# Patient Record
Sex: Male | Born: 1965 | Race: White | Hispanic: No | Marital: Married | Smoking: Never smoker
Health system: Southern US, Community
[De-identification: ages and names within clinical notes are randomized; demographics above are authoritative.]

## PROBLEM LIST (undated history)

## (undated) DIAGNOSIS — J45909 Unspecified asthma, uncomplicated: Secondary | ICD-10-CM

## (undated) HISTORY — PX: EYE SURGERY: SHX253

---

## 2020-04-27 ENCOUNTER — Other Ambulatory Visit: Payer: Self-pay

## 2020-04-27 ENCOUNTER — Emergency Department (HOSPITAL_BASED_OUTPATIENT_CLINIC_OR_DEPARTMENT_OTHER)
Admission: EM | Admit: 2020-04-27 | Discharge: 2020-04-27 | Disposition: A | Discharge status: Home / Self Care | Payer: Managed Care, Other (non HMO) | Attending: Emergency Medicine | Admitting: Emergency Medicine

## 2020-04-27 ENCOUNTER — Encounter (HOSPITAL_BASED_OUTPATIENT_CLINIC_OR_DEPARTMENT_OTHER): Payer: Self-pay

## 2020-04-27 ENCOUNTER — Emergency Department (HOSPITAL_BASED_OUTPATIENT_CLINIC_OR_DEPARTMENT_OTHER): Payer: Managed Care, Other (non HMO)

## 2020-04-27 DIAGNOSIS — R1909 Other intra-abdominal and pelvic swelling, mass and lump: Secondary | ICD-10-CM

## 2020-04-27 DIAGNOSIS — N492 Inflammatory disorders of scrotum: Secondary | ICD-10-CM | POA: Diagnosis not present

## 2020-04-27 DIAGNOSIS — J45909 Unspecified asthma, uncomplicated: Secondary | ICD-10-CM | POA: Insufficient documentation

## 2020-04-27 DIAGNOSIS — R2241 Localized swelling, mass and lump, right lower limb: Secondary | ICD-10-CM | POA: Diagnosis present

## 2020-04-27 HISTORY — DX: Unspecified asthma, uncomplicated: J45.909

## 2020-04-27 LAB — COMPREHENSIVE METABOLIC PANEL
ALT: 22 U/L (ref 0–44)
AST: 22 U/L (ref 15–41)
Albumin: 4.4 g/dL (ref 3.5–5.0)
Alkaline Phosphatase: 36 U/L — ABNORMAL LOW (ref 38–126)
Anion gap: 10 (ref 5–15)
BUN: 14 mg/dL (ref 6–20)
CO2: 24 mmol/L (ref 22–32)
Calcium: 8.9 mg/dL (ref 8.9–10.3)
Chloride: 105 mmol/L (ref 98–111)
Creatinine, Ser: 1.02 mg/dL (ref 0.61–1.24)
GFR, Estimated: >60 mL/min (ref >60)
Glucose, Bld: 102 mg/dL — ABNORMAL HIGH (ref 70–99)
Potassium: 4.2 mmol/L (ref 3.5–5.1)
Sodium: 139 mmol/L (ref 135–145)
Total Bilirubin: 0.5 mg/dL (ref 0.3–1.2)
Total Protein: 7.5 g/dL (ref 6.5–8.1)

## 2020-04-27 LAB — URINALYSIS, ROUTINE W REFLEX MICROSCOPIC
Bilirubin Urine: NEGATIVE
Glucose, UA: NEGATIVE mg/dL
Hgb urine dipstick: NEGATIVE
Ketones, ur: NEGATIVE mg/dL
Leukocytes,Ua: NEGATIVE
Nitrite: NEGATIVE
Protein, ur: NEGATIVE mg/dL
Specific Gravity, Urine: 1.015 (ref 1.005–1.030)
pH: 6.5 (ref 5.0–8.0)

## 2020-04-27 LAB — CBC WITH DIFFERENTIAL/PLATELET
Abs Immature Granulocytes: 0.01 10*3/uL (ref 0.00–0.07)
Basophils Absolute: 0.1 10*3/uL (ref 0.0–0.1)
Basophils Relative: 1 %
Eosinophils Absolute: 0.2 10*3/uL (ref 0.0–0.5)
Eosinophils Relative: 3 %
HCT: 44.2 % (ref 39.0–52.0)
Hemoglobin: 14.4 g/dL (ref 13.0–17.0)
Immature Granulocytes: 0 %
Lymphocytes Relative: 43 %
Lymphs Abs: 2.6 10*3/uL (ref 0.7–4.0)
MCH: 26.5 pg (ref 26.0–34.0)
MCHC: 32.6 g/dL (ref 30.0–36.0)
MCV: 81.4 fL (ref 80.0–100.0)
Monocytes Absolute: 0.5 10*3/uL (ref 0.1–1.0)
Monocytes Relative: 8 %
Neutro Abs: 2.8 10*3/uL (ref 1.7–7.7)
Neutrophils Relative %: 45 %
Platelets: 283 10*3/uL (ref 150–400)
RBC: 5.43 MIL/uL (ref 4.22–5.81)
RDW: 14.4 % (ref 11.5–15.5)
WBC: 6 10*3/uL (ref 4.0–10.5)
nRBC: 0 % (ref 0.0–0.2)

## 2020-04-27 MED ORDER — IBUPROFEN 800 MG PO TABS
800.0000 mg | ORAL_TABLET | Freq: Once | ORAL | Status: AC
  Administered 2020-04-27: 800 mg via ORAL
  Filled 2020-04-27: qty 1

## 2020-04-27 MED ORDER — IOHEXOL 300 MG/ML  SOLN
100.0000 mL | Freq: Once | INTRAMUSCULAR | Status: AC | PRN
  Administered 2020-04-27: 100 mL via INTRAVENOUS

## 2020-04-27 MED ORDER — LIDOCAINE HCL (PF) 1 % IJ SOLN
5.0000 mL | Freq: Once | INTRAMUSCULAR | Status: DC
  Filled 2020-04-27: qty 5

## 2020-04-27 MED ORDER — CLINDAMYCIN HCL 150 MG PO CAPS: 450.0000 mg | ORAL_CAPSULE | Freq: Three times a day (TID) | ORAL | 0 refills | Status: AC

## 2020-04-27 NOTE — ED Notes (Signed)
Patient transported to CT 

## 2020-04-27 NOTE — ED Provider Notes (Signed)
[facility] EMERGENCY DEPARTMENT Provider Note   CSN: [id number] Arrival date & time: 04/27/20  0947     History Chief Complaint  Patient presents with  . Groin Swelling    Brett Rogers is a 55 y.o. male.  55 y.o male with a PMH of Asthma presents to the ED with a chief complaint of right groin swelling for 2 days.  Patient reports noticing these suddenly, began with a sharp pain to the right groin, this has now extended onto part of his testicle.  The pain is exacerbated with sitting, movement, and palpation.  He reports taking some ibuprofen in order to help with symptoms without much improvement.  He reports being seen in urgent care and sent into the ED for further evaluation of his groin swelling.  There is redness to the area.  He has not had any penile discharge, rashes, fever, urinary symptoms. No prior history of sexually transmitted infection, he is currently sexually active with his wife for the past 20 years.  The history is provided by the patient and medical records.       Past Medical History:  Diagnosis Date  . Asthma     There are no problems to display for this patient.   Past Surgical History:  Procedure Laterality Date  . EYE SURGERY         History reviewed. No pertinent family history.  Social History   Tobacco Use  . Smoking status: Never Smoker  . Smokeless tobacco: Never Used  Vaping Use  . Vaping Use: Never used  Substance Use Topics  . Alcohol use: Never  . Drug use: Never    Home Medications Prior to Admission medications   Medication Sig Start Date End Date Taking? Authorizing Provider  clindamycin (CLEOCIN) 150 MG capsule Take 3 capsules (450 mg total) by mouth 3 (three) times daily for 7 days. 04/27/20 05/04/20 Yes Soto, Leonie DouglasJohana, PA-C  ibuprofen (ADVIL) 200 MG tablet Take 200 mg by mouth every 6 (six) hours as needed.   Yes [provider]  vitamin C (ASCORBIC ACID) 500 MG tablet Take 500 mg by mouth daily.    Yes [provider]  VITAMIN D-VITAMIN K PO Take by mouth.   Yes [provider]    Allergies    Patient has no known allergies.  Review of Systems   Review of Systems  Constitutional: Negative for fever.  HENT: Negative for sore throat.   Respiratory: Negative for shortness of breath.   Cardiovascular: Negative for chest pain.  Gastrointestinal: Negative for abdominal pain, diarrhea, nausea and vomiting.  Genitourinary: Positive for testicular pain. Negative for flank pain, genital sores, hematuria, penile discharge and penile pain.  Musculoskeletal: Negative for back pain and neck pain.  Skin: Positive for color change.  Neurological: Negative for light-headedness and headaches.  All other systems reviewed and are negative.   Physical Exam Updated Vital Signs BP 125/64 (BP Location: Right Arm)   Pulse 70   Temp 97.7 F (36.5 C) (Oral)   Resp 18   Ht 5\' 9"  (1.753 m)   Wt 79.4 kg   SpO2 98%   BMI 25.84 kg/m   Physical Exam Vitals and nursing note reviewed. Exam conducted with a chaperone present.  Constitutional:      Appearance: Normal appearance.  HENT:     Head: Normocephalic and atraumatic.     Nose: Nose normal.     Mouth/Throat:     Mouth: Mucous membranes are moist.  Eyes:     Pupils: Pupils are equal, round, and reactive to light.  Cardiovascular:     Rate and Rhythm: Normal rate.  Pulmonary:     Effort: Pulmonary effort is normal.     Breath sounds: No wheezing.  Abdominal:     General: Abdomen is flat.     Tenderness: There is no abdominal tenderness.     Hernia: There is no hernia in the left inguinal area or right inguinal area.  Genitourinary:    Pubic Area: No rash.      Penis: Circumcised. No tenderness, discharge or lesions.      Testes:        Right: Tenderness present.        Left: Tenderness not present.     Epididymis:     Right: Enlarged. Tenderness present.     Left: Not enlarged. No tenderness.       Comments:  Erythema, induration and streaking of the right groin extending into the right testicular region.  Musculoskeletal:     Cervical back: Normal range of motion and neck supple.  Skin:    General: Skin is warm and dry.  Neurological:     Mental Status: He is alert and oriented to person, place, and time.     ED Results / Procedures / Treatments   Labs (all labs ordered are listed, but only abnormal results are displayed) Labs Reviewed  COMPREHENSIVE METABOLIC PANEL - Abnormal; Notable for the following components:      Result Value   Glucose, Bld 102 (*)    Alkaline Phosphatase 36 (*)    All other components within normal limits  CBC WITH DIFFERENTIAL/PLATELET  URINALYSIS, ROUTINE W REFLEX MICROSCOPIC  GC/CHLAMYDIA PROBE AMP ([place]) NOT AT [facility]    EKG None  Radiology CT PELVIS W CONTRAST  Result Date: 04/27/2020 CLINICAL DATA:  Abscess, anal or rectal. Additional history provided: Right-sided testicular mass with tenderness for 2 days; radiopaque marker placed in area of concern. EXAM: CT PELVIS WITH CONTRAST TECHNIQUE: Multidetector CT imaging of the pelvis was performed using the standard protocol following the bolus administration of intravenous contrast. CONTRAST:  OMNIPAQUE IOHEXOL 300 MG/ML  SOLN COMPARISON:  No pertinent prior exams available for comparison. FINDINGS: Urinary Tract:  No abnormality visualized. Bowel:  Unremarkable visualized pelvic bowel loops. Vascular/Lymphatic: No pathologically enlarged lymph nodes. Mild calcified plaque within the bilateral common iliac arteries. Reproductive: Within the subcutaneous soft tissues of the right groin, lateral to the distal aspect of the spermatic cord, there is a nonspecific 1.3 cm round focus of induration (series 5, image 109). There is mild surrounding inflammatory stranding. This finding is located immediately deep to the skin surface marker. Musculoskeletal: No acute bony abnormality or aggressive osseous lesion.  IMPRESSION: 1.3 cm nonspecific round focus of induration within the subcutaneous soft tissues of the right groin, lateral to the distal aspect of the spermatic cord. There is mild surrounding inflammatory stranding. This may reflect a small abscess or small region of phlegmon with surrounding cellulitis, and targeted ultrasound may be helpful for further characterization. Additionally, close clinical follow-up (with imaging follow-up as clinically warranted) recommended to ensure resolution and exclude alternative etiologies (including but not limited to neoplasm). Electronically Signed   By: Jackey Loge DO   On: 04/27/2020 12:07    Procedures .Marland KitchenIncision and Drainage  Date/Time: 04/27/2020 2:05 PM Performed by: Claude Manges, PA-C Authorized by: Claude Manges, PA-C   Consent:    Consent obtained:  Verbal  Consent given by:  Patient   Risks, benefits, and alternatives were discussed: yes   Universal protocol:    Patient identity confirmed:  Verbally with patient Location:    Type:  Abscess   Location:  Anogenital   Anogenital location:  Scrotal wall Pre-procedure details:    Skin preparation:  Povidone-iodine Sedation:    Sedation type:  None Anesthesia:    Anesthesia method:  Local infiltration   Local anesthetic:  Lidocaine 1% w/o epi Procedure details:    Incision depth:  Dermal   Drainage:  Purulent   Drainage amount:  Scant   Wound treatment:  Wound left open   Packing materials:  1/4 in iodoform gauze Post-procedure details:    Procedure completion:  Tolerated well, no immediate complications     Medications Ordered in ED Medications  lidocaine (PF) (XYLOCAINE) 1 % injection 5 mL (has no administration in time range)  iohexol (OMNIPAQUE) 300 MG/ML solution 100 mL (100 mLs Intravenous Contrast Given 04/27/20 1107)  ibuprofen (ADVIL) tablet 800 mg (800 mg Oral Given 04/27/20 1317)    ED Course  I have reviewed the triage vital signs and the nursing notes.  Pertinent  labs & imaging results that were available during my care of the patient were reviewed by me and considered in my medical decision making (see chart for details).    MDM Rules/Calculators/A&P  Patient with no pertinent past medical history presents to the ED with a chief complaint of right groin swelling, evaluated at urgent care earlier and sent here for further evaluation.  This has been ongoing for 2 days, there is tenderness to palpation along the area, exacerbated with movement along with sitting relieved while lying flat.  Has taken ibuprofen without any symptomatic relief.  He has not had any fevers, no urinary symptoms, no rashes, no other involvement.  During evaluation with chaperone RN at the bedside, there is erythema, induration, streaking present.  Abdomen is soft, nontender to palpation.  Chest is without any tenderness, lungs are clear to auscultation.  We discussed likely abscess involving testicular region, discussed need for labs along with likely imaging to evaluate involvement.   Interpretation of his labs without any leukocytosis, hemoglobin stable.  CMP without any electrolyte normality, his kidney functions unremarkable.  LFTs are within normal limits.  His UA is without any signs of infection.  GC was added onto this for routine screening.  CT of his groin showed: 1.3 cm nonspecific round focus of induration within the subcutaneous  soft tissues of the right groin, lateral to the distal aspect of the  spermatic cord. There is mild surrounding inflammatory stranding.  This may reflect a small abscess or small region of phlegmon with  surrounding cellulitis, and targeted ultrasound may be helpful for  further characterization. Additionally, close clinical follow-up  (with imaging follow-up as clinically warranted) recommended to  ensure resolution and exclude alternative etiologies (including but  not limited to neoplasm).     Bedside ultrasound performed by me, which  showed a superficial pocket with fluid collection. I have I&D patients abscess, scant purulent discharged expressed. Erythema and pressure has improved her patient.  Discussed antibiotic therapy with patient as well, he will go home on a clindamycin regimen for the next 7 days.  Tolerated procedure well.  Return precautions discussed at length with patient and wife.  Patient stable for discharge.  Portions of this note were generated with Scientist, clinical (histocompatibility and immunogenetics). Dictation errors may occur despite best attempts at proofreading.  Final Clinical Impression(s) / ED Diagnoses Final diagnoses:  Groin swelling    Rx / DC Orders ED Discharge Orders         Ordered    clindamycin (CLEOCIN) 150 MG capsule  3 times daily        04/27/20 1410           Claude Manges, PA-C 04/27/20 1411    Little, Ambrose Finland, MD 04/27/20 1521

## 2020-04-27 NOTE — ED Notes (Signed)
Area of pain visualized, redness and swelling noted. Very tender to touch per pt statement

## 2020-04-27 NOTE — ED Notes (Signed)
PA at bedside for I & D.

## 2020-04-27 NOTE — Discharge Instructions (Addendum)
I have prescribed antibiotics to help treat your infection, please take 3 tablets three times a day for the next 7 days.  Pleas keep your wound clean and dry.   If you experience any fever, worsening symptoms please return to the emergency department.

## 2020-04-27 NOTE — ED Triage Notes (Signed)
Pt arrives stating he has a "lump" on his right groin since Thursday. C/o pain to groin, denies testicle involvement. Was sent here by UC

## 2020-04-28 ENCOUNTER — Emergency Department (HOSPITAL_BASED_OUTPATIENT_CLINIC_OR_DEPARTMENT_OTHER)
Admission: EM | Admit: 2020-04-28 | Discharge: 2020-04-28 | Disposition: A | Discharge status: Home / Self Care | Payer: Managed Care, Other (non HMO) | Attending: Emergency Medicine | Admitting: Emergency Medicine

## 2020-04-28 DIAGNOSIS — J45909 Unspecified asthma, uncomplicated: Secondary | ICD-10-CM | POA: Diagnosis not present

## 2020-04-28 DIAGNOSIS — R1030 Lower abdominal pain, unspecified: Secondary | ICD-10-CM | POA: Diagnosis present

## 2020-04-28 DIAGNOSIS — L03314 Cellulitis of groin: Secondary | ICD-10-CM | POA: Diagnosis not present

## 2020-04-28 MED ORDER — LIDOCAINE-EPINEPHRINE 2 %-1:100000 IJ SOLN
5.0000 mL | Freq: Once | INTRAMUSCULAR | Status: AC
  Administered 2020-04-28: 5 mL via INTRADERMAL
  Filled 2020-04-28: qty 5.1

## 2020-04-28 NOTE — ED Notes (Signed)
Pt ambulatory with steady gait to RR.

## 2020-04-28 NOTE — ED Notes (Signed)
States was here yesterday d/t right groin abscess. The abscess was drained during is visit per triage notes

## 2020-04-28 NOTE — ED Triage Notes (Signed)
States noted another area of swelling above area he had drained yesterday. He was concern so he came back to be seen. At 3 am had a burning pain rated 9 took motrin and now a 6.  States has taken 2 does of antibiotic since yesterday.

## 2020-04-28 NOTE — ED Notes (Signed)
ED Provider at bedside with US 

## 2020-04-28 NOTE — ED Provider Notes (Signed)
[facility] EMERGENCY DEPARTMENT Provider Note   CSN: [id number] Arrival date & time: 04/28/20  9476     History Chief Complaint  Patient presents with   Abscess    Shaunte Weissinger is a 55 y.o. male.  HPI     55 year old male with history of asthma, emergency department visit yesterday which she had a CT showing induration within the subcutaneous soft tissues of the right groin, was found to have small abscess which was drained and given prescription for clindamycin yesterday, who presents with concern for increasing pain.  Reports burning pain last night and felt there was another area of swelling above where he had the incision and drainage yesterday. No fevers, chills, nausea, vomiting or other abnormalities.  Has had 2 doses of abx.   Past Medical History:  Diagnosis Date   Asthma     There are no problems to display for this patient.   Past Surgical History:  Procedure Laterality Date   EYE SURGERY         No family history on file.  Social History   Tobacco Use   Smoking status: Never Smoker   Smokeless tobacco: Never Used  Vaping Use   Vaping Use: Never used  Substance Use Topics   Alcohol use: Never   Drug use: Never    Home Medications Prior to Admission medications   Medication Sig Start Date End Date Taking? Authorizing Provider  clindamycin (CLEOCIN) 150 MG capsule Take 3 capsules (450 mg total) by mouth 3 (three) times daily for 7 days. 04/27/20 05/04/20  Claude Manges, PA-C  ibuprofen (ADVIL) 200 MG tablet Take 200 mg by mouth every 6 (six) hours as needed.    [provider]  vitamin C (ASCORBIC ACID) 500 MG tablet Take 500 mg by mouth daily.    [provider]  VITAMIN D-VITAMIN K PO Take by mouth.    [provider]    Allergies    Patient has no known allergies.  Review of Systems   Review of Systems  Constitutional: Negative for fever.  Respiratory: Negative for cough.   Gastrointestinal:  Negative for abdominal pain, nausea and vomiting.  Genitourinary: Negative for difficulty urinating and dysuria.  Skin: Positive for rash and wound.    Physical Exam Updated Vital Signs BP 112/73 (BP Location: Right Arm)    Pulse 72    Temp 98.2 F (36.8 C) (Oral)    Resp 16    Ht 5\' 9"  (1.753 m)    Wt 82.8 kg    SpO2 98%    BMI 26.95 kg/m   Physical Exam Vitals and nursing note reviewed.  Constitutional:      General: He is not in acute distress.    Appearance: Normal appearance. He is not ill-appearing, toxic-appearing or diaphoretic.  HENT:     Head: Normocephalic.  Eyes:     Conjunctiva/sclera: Conjunctivae normal.  Cardiovascular:     Rate and Rhythm: Normal rate and regular rhythm.     Pulses: Normal pulses.  Pulmonary:     Effort: Pulmonary effort is normal. No respiratory distress.  Musculoskeletal:        General: Tenderness (right groin) present. No deformity or signs of injury.     Cervical back: No rigidity.  Skin:    General: Skin is warm and dry.     Coloration: Skin is not jaundiced or pale.     Comments: .5cm incision right groin, no surrounding erythema, does have surrounding induration and  tenderness approx 3cm diameter  Neurological:     General: No focal deficit present.     Mental Status: He is alert and oriented to person, place, and time.     ED Results / Procedures / Treatments   Labs (all labs ordered are listed, but only abnormal results are displayed) Labs Reviewed - No data to display  EKG None  Radiology CT PELVIS W CONTRAST  Result Date: 04/27/2020 CLINICAL DATA:  Abscess, anal or rectal. Additional history provided: Right-sided testicular mass with tenderness for 2 days; radiopaque marker placed in area of concern. EXAM: CT PELVIS WITH CONTRAST TECHNIQUE: Multidetector CT imaging of the pelvis was performed using the standard protocol following the bolus administration of intravenous contrast. CONTRAST:  OMNIPAQUE IOHEXOL 300 MG/ML   SOLN COMPARISON:  No pertinent prior exams available for comparison. FINDINGS: Urinary Tract:  No abnormality visualized. Bowel:  Unremarkable visualized pelvic bowel loops. Vascular/Lymphatic: No pathologically enlarged lymph nodes. Mild calcified plaque within the bilateral common iliac arteries. Reproductive: Within the subcutaneous soft tissues of the right groin, lateral to the distal aspect of the spermatic cord, there is a nonspecific 1.3 cm round focus of induration (series 5, image 109). There is mild surrounding inflammatory stranding. This finding is located immediately deep to the skin surface marker. Musculoskeletal: No acute bony abnormality or aggressive osseous lesion. IMPRESSION: 1.3 cm nonspecific round focus of induration within the subcutaneous soft tissues of the right groin, lateral to the distal aspect of the spermatic cord. There is mild surrounding inflammatory stranding. This may reflect a small abscess or small region of phlegmon with surrounding cellulitis, and targeted ultrasound may be helpful for further characterization. Additionally, close clinical follow-up (with imaging follow-up as clinically warranted) recommended to ensure resolution and exclude alternative etiologies (including but not limited to neoplasm). Electronically Signed   By: Jackey Loge DO   On: 04/27/2020 12:07    Procedures Procedures   Medications Ordered in ED Medications  lidocaine-EPINEPHrine (XYLOCAINE W/EPI) 2 %-1:100000 (with pres) injection 5 mL (5 mLs Intradermal Given by Other 04/28/20 4166)    ED Course  I have reviewed the triage vital signs and the nursing notes.  Pertinent labs & imaging results that were available during my care of the patient were reviewed by me and considered in my medical decision making (see chart for details).    MDM Rules/Calculators/A&P                          Very pleasant 55 year old male with history of asthma, emergency department visit yesterday which  she had a CT showing induration within the subcutaneous soft tissues of the right groin, was found to have small abscess which was drained and given prescription for clindamycin, who presents with concern for possible continuing abscess.  Small hypodense area on [place] with numbing and incision made without purulent drainage.  Suspect continuing pain and tenderness due to cellulitis and recommend continuing antibiotics and discussed reasons to return.     Final Clinical Impression(s) / ED Diagnoses Final diagnoses:  Cellulitis of groin    Rx / DC Orders ED Discharge Orders    None       Alvira Monday, MD 04/29/20 [phone]

## 2020-04-29 LAB — GC/CHLAMYDIA PROBE AMP (~~LOC~~) NOT AT ARMC
Chlamydia: NEGATIVE
Comment: NEGATIVE
Comment: NORMAL
Neisseria Gonorrhea: NEGATIVE

## 2022-10-13 IMAGING — CT CT PELVIS W/ CM
2 of 3 series · 15 of 46 positions shown, 17 images · IV contrast (omnipaque)
Comparison: No pertinent prior exams available for comparison.

CLINICAL DATA: Abscess, anal or rectal. Additional history
provided: Right-sided testicular mass with tenderness for 2 days;
radiopaque marker placed in area of concern.

EXAM:
CT PELVIS WITH CONTRAST
TECHNIQUE: Multidetector CT imaging of the pelvis was performed using the
standard protocol following the bolus administration of intravenous
contrast.
CONTRAST:  100mL OMNIPAQUE IOHEXOL 300 MG/ML  SOLN

[Series 5: axial soft tissue · axial · 0.85mm/px · z∈[-725,-459]mm · 12 of 153 slices shown, 14 images]
[im 10/153  soft-tissue]
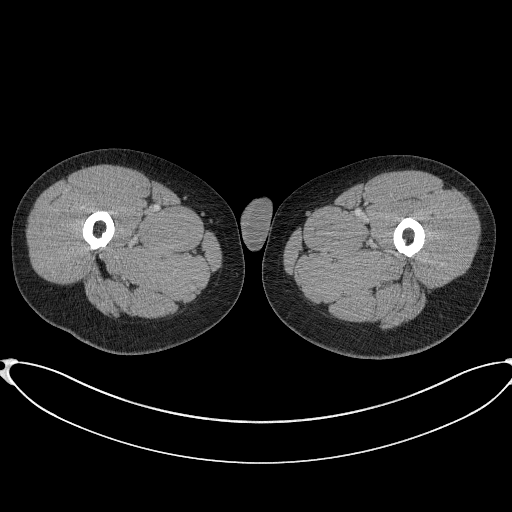
[im 10/153  bone]
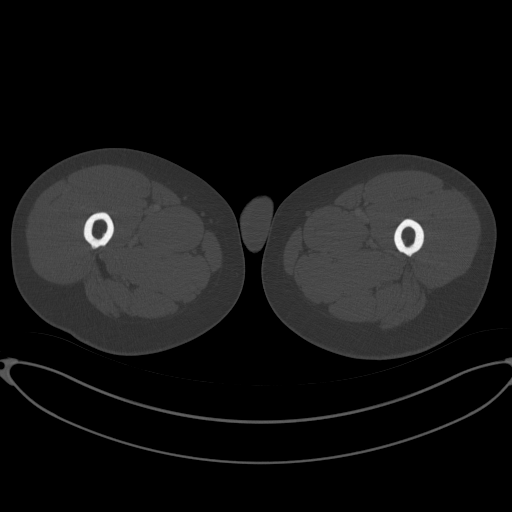
[im 20/153  soft-tissue]
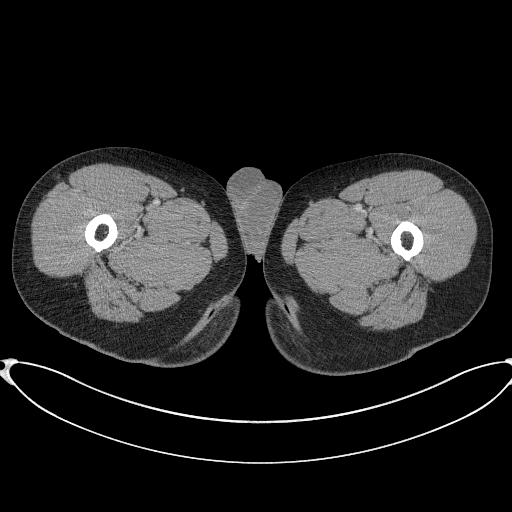
[im 35/153  soft-tissue]
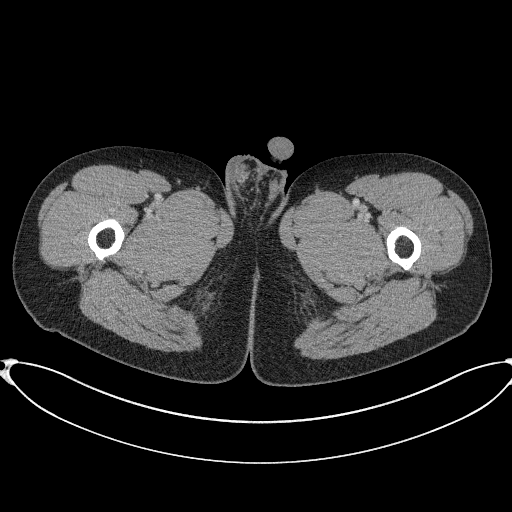
[im 45/153  soft-tissue]
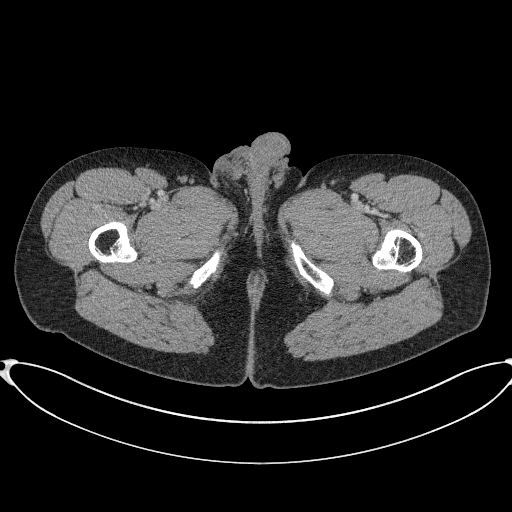
[im 59/153  soft-tissue]
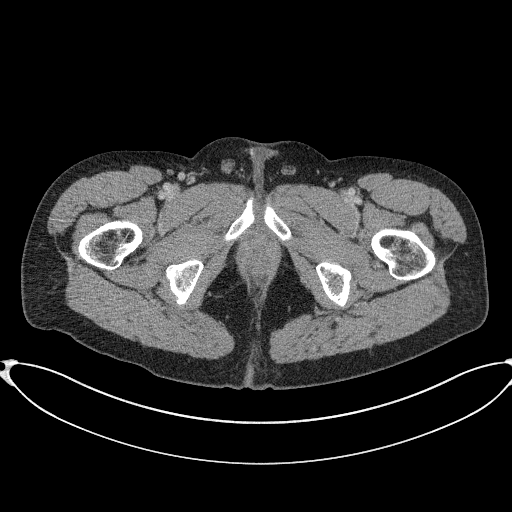
[im 69/153  soft-tissue]
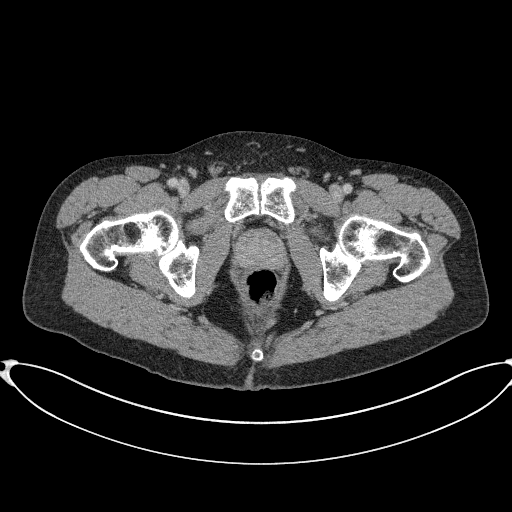
[im 84/153  soft-tissue]
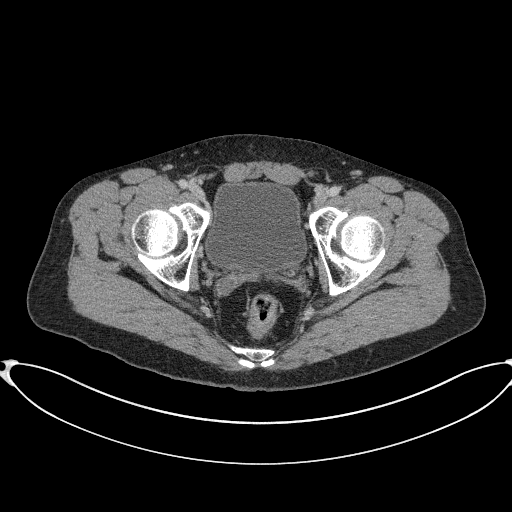
[im 94/153  soft-tissue]
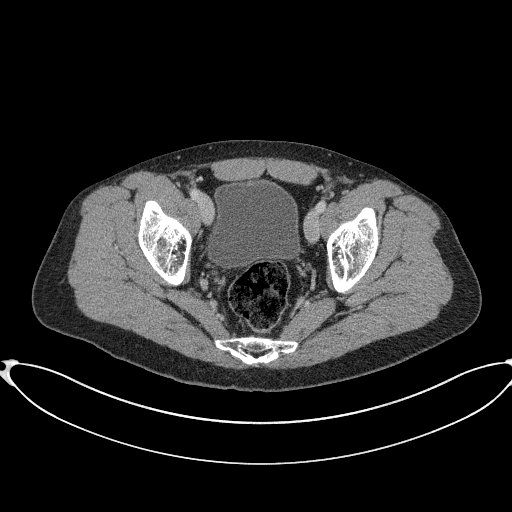
[im 108/153  soft-tissue]
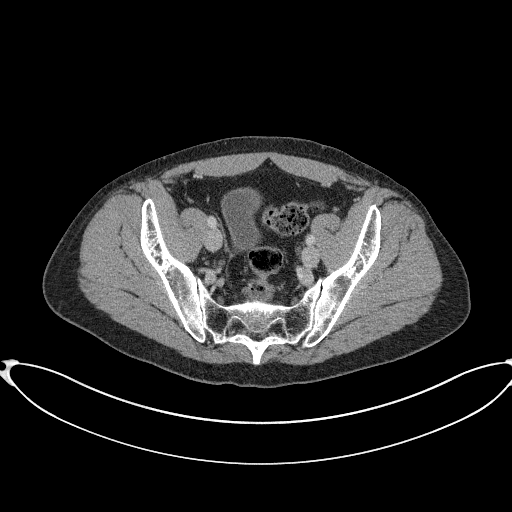
[im 108/153  bone]
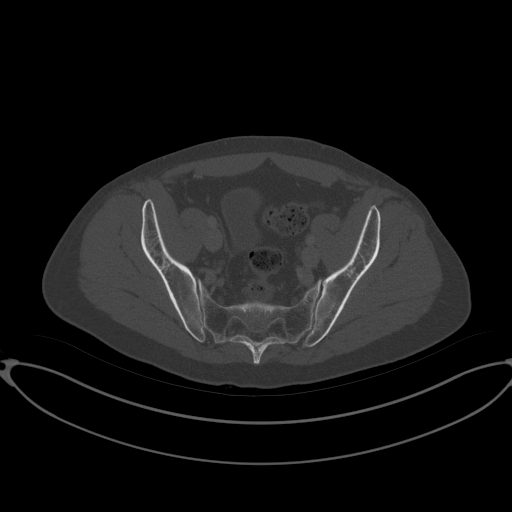
[im 118/153  soft-tissue]
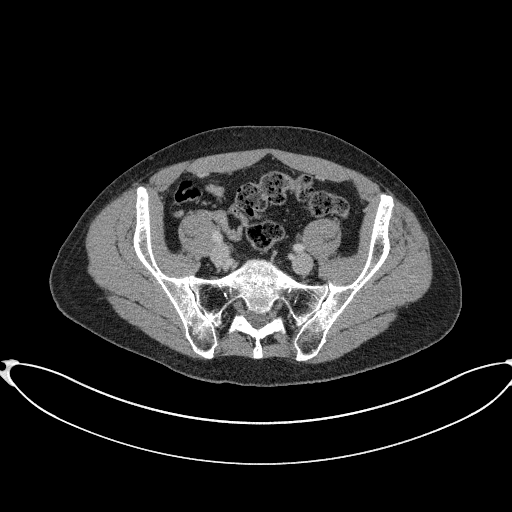
[im 133/153  soft-tissue]
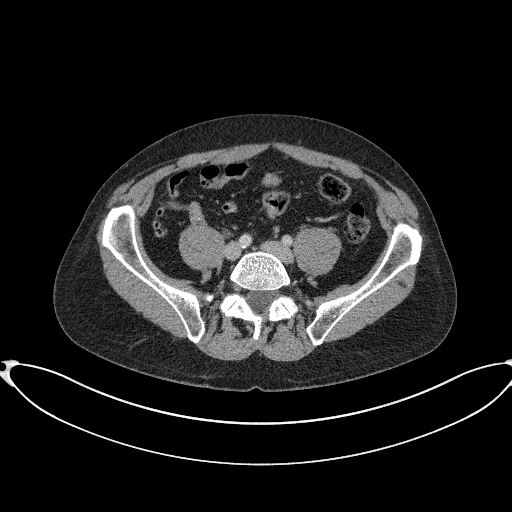
[im 143/153  soft-tissue]
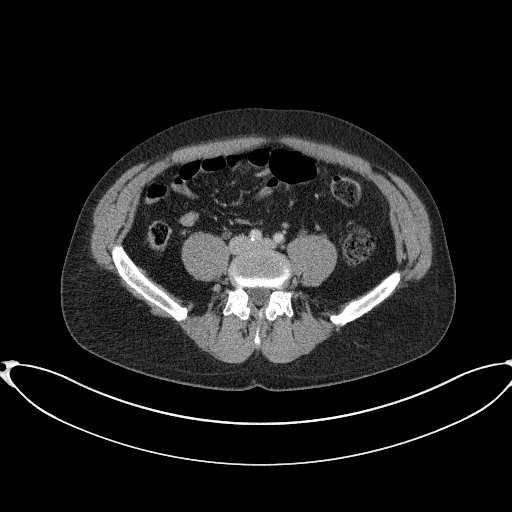

[Series 7: coronal st · coronal · 0.61mm/px · 3 of 127 slices shown]
[im 43/127  soft-tissue]
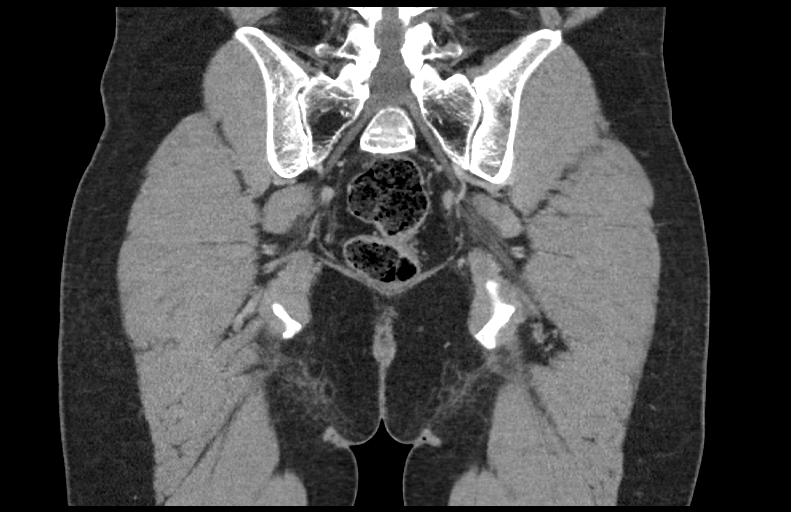
[im 57/127  soft-tissue]
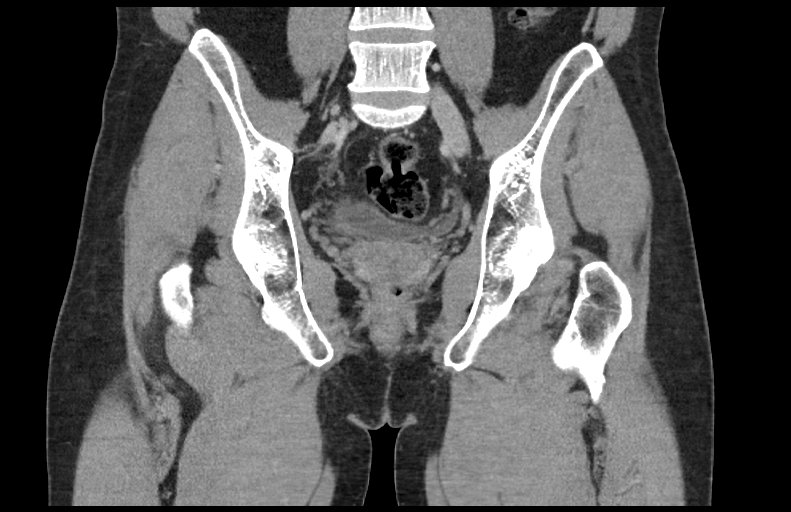
[im 71/127  soft-tissue]
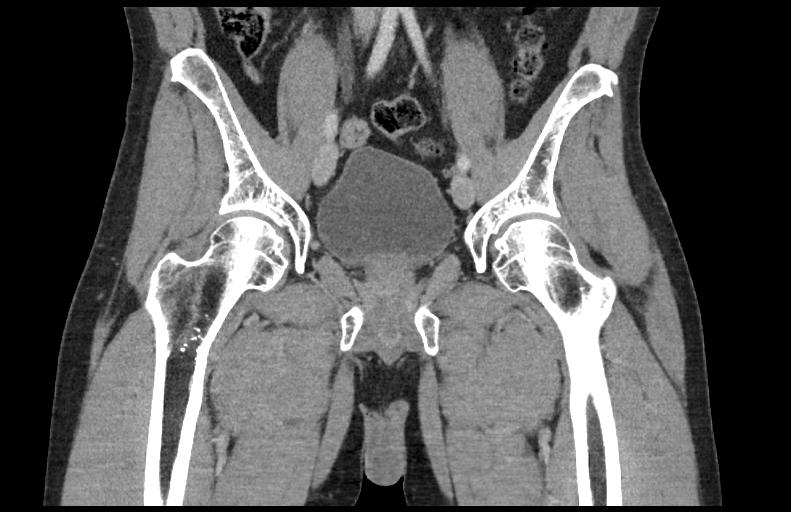

[15 of 46 positions shown; findings below may reference images not displayed]

FINDINGS: Urinary Tract:  No abnormality visualized.

Bowel:  Unremarkable visualized pelvic bowel loops.

Vascular/Lymphatic: No pathologically enlarged lymph nodes. Mild
calcified plaque within the bilateral common iliac arteries.

Reproductive: Within the subcutaneous soft tissues of the right
groin, lateral to the distal aspect of the spermatic cord, there is
a nonspecific 1.3 cm round focus of induration (series 5, image
109). There is mild surrounding inflammatory stranding. This finding
is located immediately deep to the skin surface marker.

Musculoskeletal: No acute bony abnormality or aggressive osseous
lesion.
IMPRESSION: 1.3 cm nonspecific round focus of induration within the subcutaneous
soft tissues of the right groin, lateral to the distal aspect of the
spermatic cord. There is mild surrounding inflammatory stranding.
This may reflect a small abscess or small region of phlegmon with
surrounding cellulitis, and targeted ultrasound may be helpful for
further characterization. Additionally, close clinical follow-up
(with imaging follow-up as clinically warranted) recommended to
ensure resolution and exclude alternative etiologies (including but
not limited to neoplasm).
